# Patient Record
Sex: Female | Born: 2000 | Hispanic: No | Marital: Single | Smoking: Never smoker
Health system: Southern US, Community
[De-identification: ages and names within clinical notes are randomized; demographics above are authoritative.]

## PROBLEM LIST (undated history)

## (undated) DIAGNOSIS — Z789 Other specified health status: Secondary | ICD-10-CM

## (undated) HISTORY — PX: WISDOM TOOTH EXTRACTION: SHX21

---
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
## 2019-02-27 IMAGING — US US MFM OB COMP +14 WKS
1 series · 14 of 28 positions shown · non-contrast
Comparison: none

[Series 1: us mfm ob comp +14 wks · 118 acquisitions, 14 frames shown]
[im 5/118]
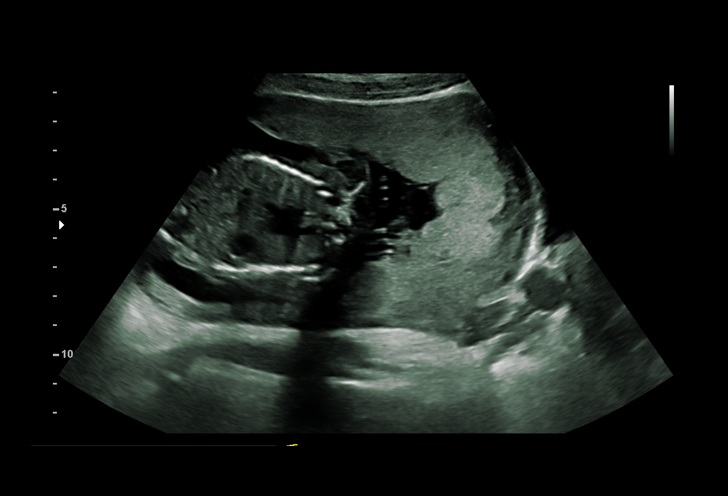
[im 14/118]
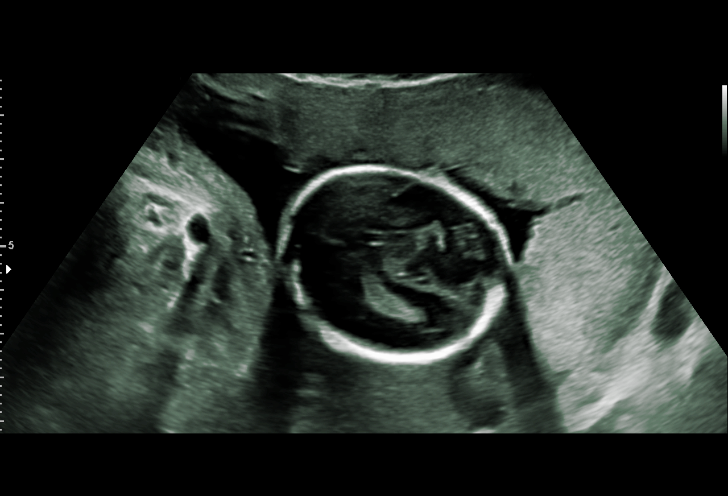
[im 22/118]
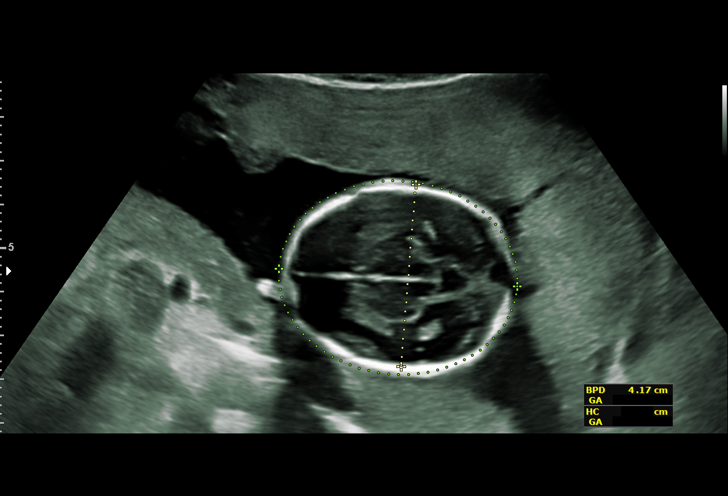
[im 31/118]
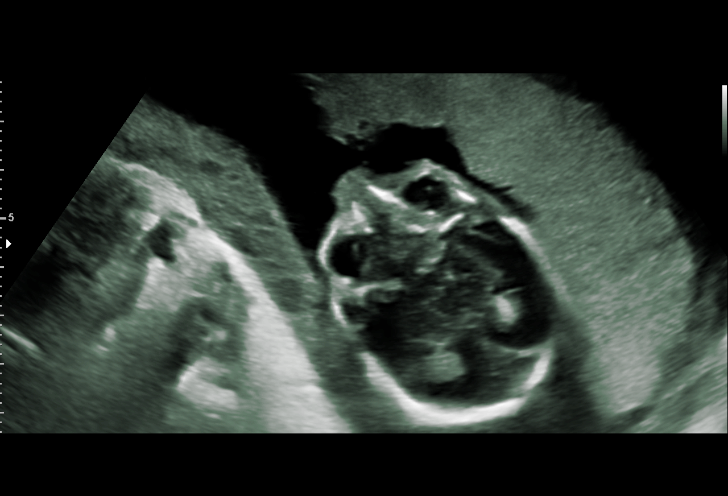
[im 40/118]
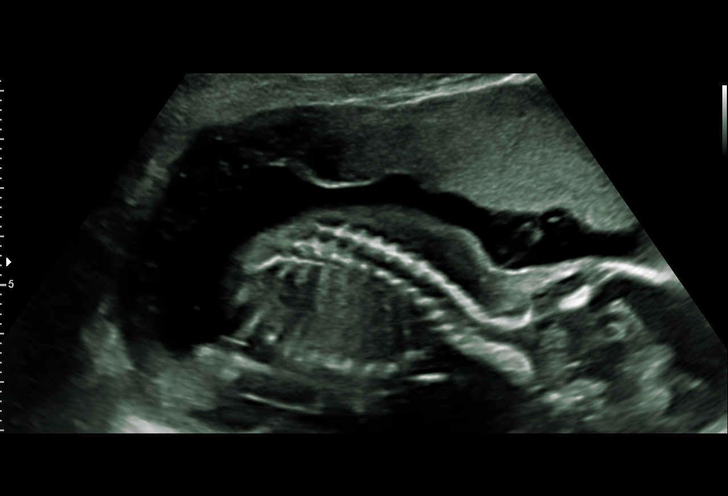
[im 48/118]
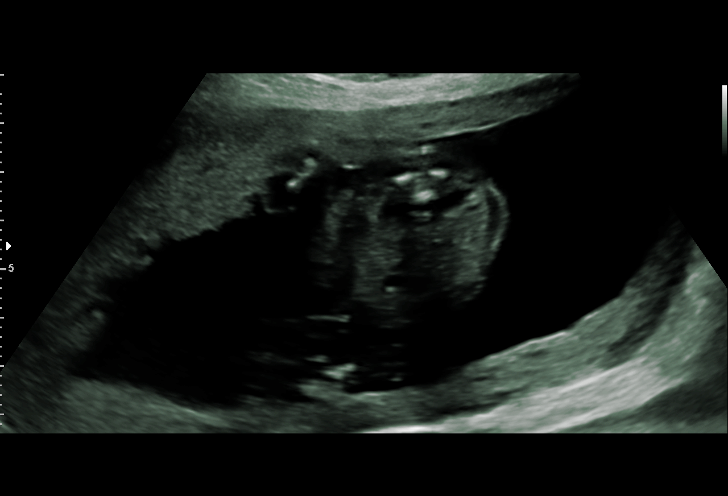
[im 57/118]
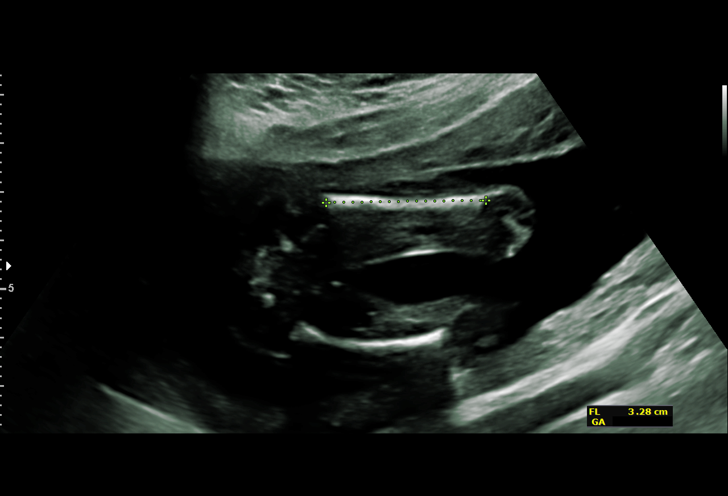
[im 66/118]
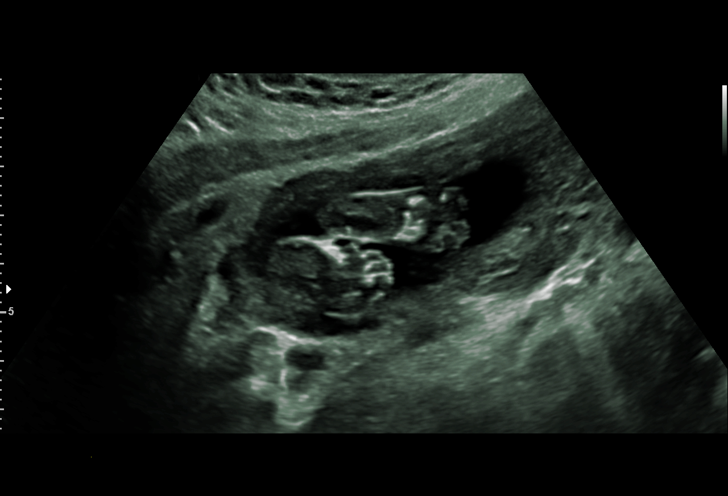
[im 74/118]
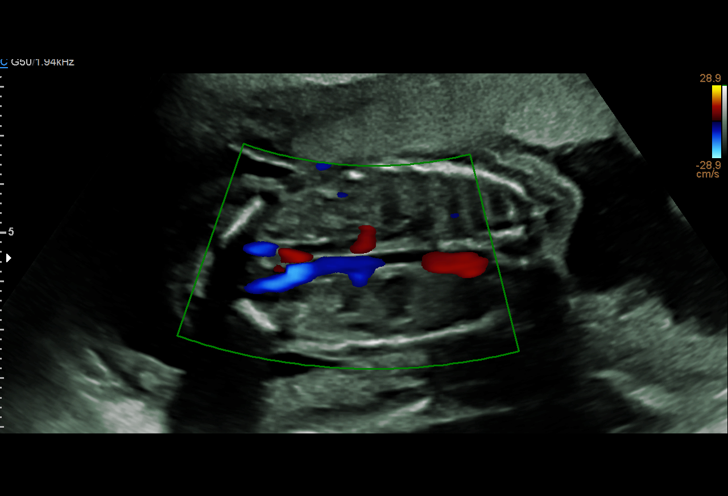
[im 83/118]
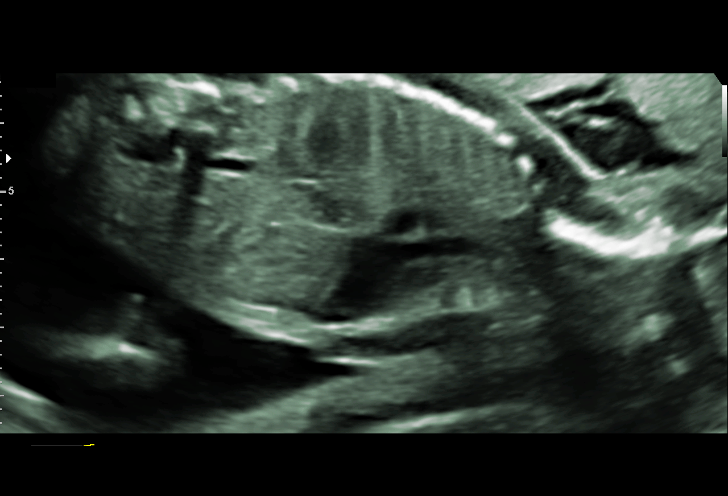
[im 92/118]
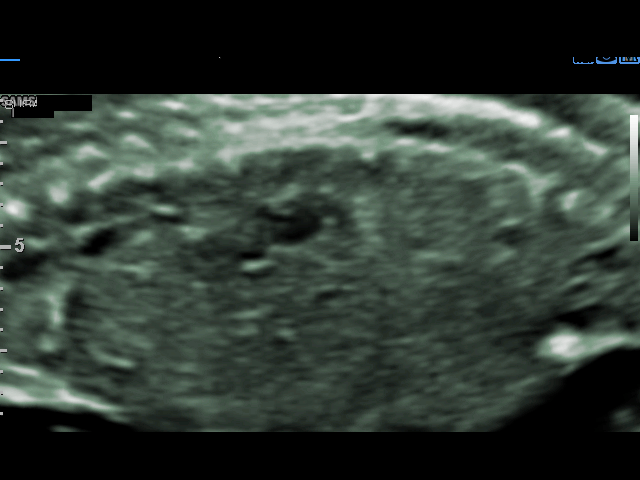
[im 100/118]
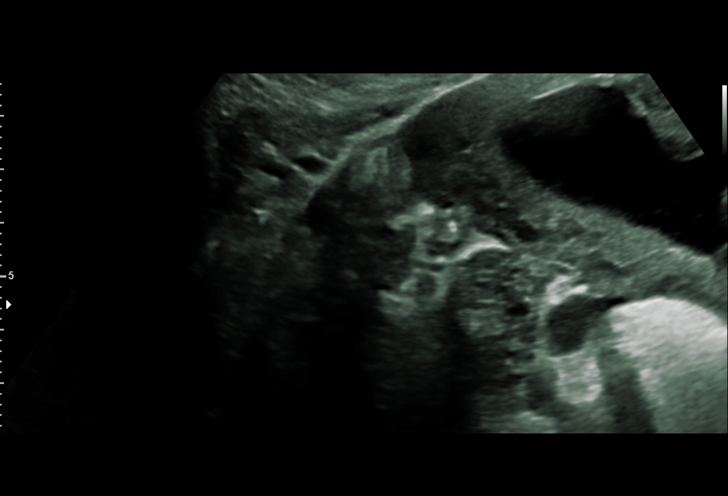
[im 109/118]
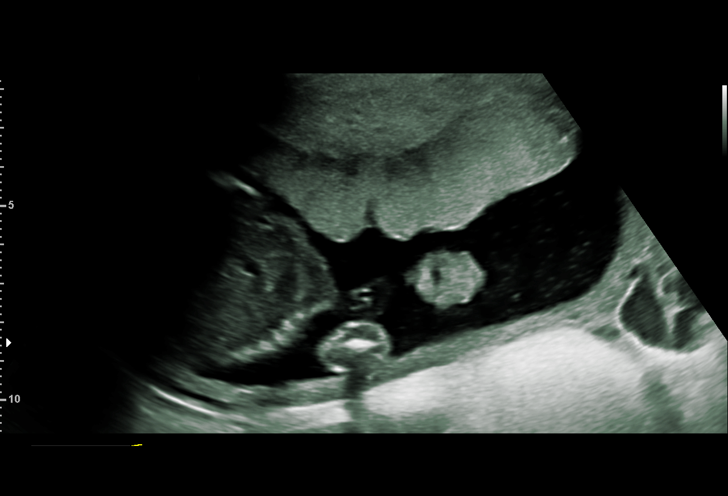
[im 118/118]
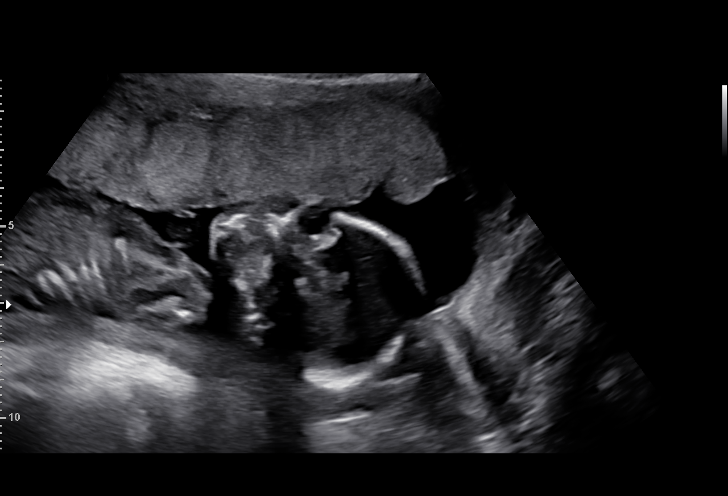

[14 of 28 positions shown; findings below may reference images not displayed]

[address]           [id number]      [id number]     [id number]
Indications

19 weeks gestation of pregnancy
Encounter for antenatal screening for
malformations
OB History

Gravidity:    1
Fetal Evaluation

Num Of Fetuses:     1
Cardiac Activity:   Observed
Presentation:       Cephalic
Placenta:           Anterior, above cervical os

Amniotic Fluid
AFI FV:      Subjectively within normal limits

Largest Pocket(cm)
4.2
Biometry

BPD:        42  mm     G. Age:  18w 5d         32  %    CI:        73.22   %    70 - 86
FL/HC:      21.5   %    16.1 -
HC:       156   mm     G. Age:  18w 4d         17  %    HC/AC:      1.14        1.09 -
AC:      137.1  mm     G. Age:  19w 1d         46  %    FL/BPD:     79.8   %
FL:       33.5  mm     G. Age:  20w 4d         86  %    FL/AC:      24.4   %    20 - 24
HUM:      32.7  mm     G. Age:  21w 0d       > 95  %
CER:      19.3  mm     G. Age:  18w 5d         37  %
NFT:       2.3  mm
LV:        5.2  mm
CM:        3.4  mm

Est. FW:     303  gm    0 lb 11 oz      53  %
Gestational Age

LMP:           19w 1d        Date:  12/04/16                 EDD:   09/10/17
U/S Today:     19w 2d                                        EDD:   09/09/17
Best:          19w 1d     Det. By:  LMP  (12/04/16)          EDD:   09/10/17
Anatomy

Cranium:               Appears normal         Aortic Arch:            Appears normal
Cavum:                 Appears normal         Ductal Arch:            Appears normal
Ventricles:            Appears normal         Diaphragm:              Appears normal
Choroid Plexus:        Appears normal         Stomach:                Appears normal, left
sided
Cerebellum:            Appears normal         Abdomen:                Appears normal
Posterior Fossa:       Appears normal         Abdominal Wall:         Appears nml (cord
insert, abd wall)
Nuchal Fold:           Appears normal         Cord Vessels:           Appears normal (3
vessel cord)
Face:                  Appears normal         Kidneys:                Appear normal
(orbits and profile)
Lips:                  Appears normal         Bladder:                Appears normal
Thoracic:              Appears normal         Spine:                  Appears normal
Heart:                 Appears normal         Upper Extremities:      Appears normal
(4CH, axis, and
situs)
RVOT:                  Appears normal         Lower Extremities:      Appears normal
LVOT:                  Appears normal

Other:  Nasal bone visualized. Heels visualized. Male gender.
Cervix Uterus Adnexa

Cervix
Length:            3.2  cm.
Normal appearance by transabdominal scan.

Left Ovary
Within normal limits.

Right Ovary
Not visualized.  No adnexal mass visualized.
Impression

SIUP at 86w8d
active fetus
no defects seen
EFW 53rd%'le
no previa
cervix is long and closed
Recommendations

Follow up as clinicallly indicated.

## 2020-09-09 ENCOUNTER — Other Ambulatory Visit: Payer: Self-pay

## 2020-09-09 ENCOUNTER — Ambulatory Visit (HOSPITAL_COMMUNITY)
Admission: EM | Admit: 2020-09-09 | Discharge: 2020-09-09 | Disposition: A | Discharge status: Home / Self Care | Payer: Medicaid Other | Attending: Physician Assistant | Admitting: Physician Assistant

## 2020-09-09 ENCOUNTER — Encounter (HOSPITAL_COMMUNITY): Payer: Self-pay

## 2020-09-09 DIAGNOSIS — R051 Acute cough: Secondary | ICD-10-CM | POA: Diagnosis not present

## 2020-09-09 DIAGNOSIS — G44209 Tension-type headache, unspecified, not intractable: Secondary | ICD-10-CM | POA: Diagnosis not present

## 2020-09-09 DIAGNOSIS — Z20822 Contact with and (suspected) exposure to covid-19: Secondary | ICD-10-CM | POA: Diagnosis not present

## 2020-09-09 DIAGNOSIS — R059 Cough, unspecified: Secondary | ICD-10-CM

## 2020-09-09 DIAGNOSIS — J029 Acute pharyngitis, unspecified: Secondary | ICD-10-CM | POA: Diagnosis not present

## 2020-09-09 DIAGNOSIS — J069 Acute upper respiratory infection, unspecified: Secondary | ICD-10-CM | POA: Diagnosis not present

## 2020-09-09 LAB — SARS CORONAVIRUS 2 (TAT 6-24 HRS): SARS Coronavirus 2: NEGATIVE

## 2020-09-09 MED ORDER — PREDNISONE 20 MG PO TABS: 40.0000 mg | ORAL_TABLET | Freq: Every day | ORAL | 0 refills | Status: AC

## 2020-09-09 MED ORDER — BENZONATATE 100 MG PO CAPS: 100.0000 mg | ORAL_CAPSULE | Freq: Three times a day (TID) | ORAL | 0 refills | Status: AC

## 2020-09-09 NOTE — ED Triage Notes (Signed)
Pt in with c/o fever tmax 99.5 today and st x 2 days  Pt has not had otc meds for sxs

## 2020-09-09 NOTE — ED Provider Notes (Signed)
[facility]    CSN: [id number] Arrival date & time: 09/09/20  1424      History   Chief Complaint Chief Complaint  Patient presents with   Fever   Sore Throat    HPI Melinda Barry is a 20 y.o. female.   Patient presents today with a 2-day history of URI symptoms.  Reports cough, nasal congestion, sneezing, sore throat, fever, fatigue.  Denies any chest pain, shortness of breath, nausea, vomiting.  She has tried over-the-counter medications without improvement of symptoms.  She reports several were contacts with similar symptoms ultimately diagnosed with COVID-19.  She has had COVID-19 vaccination but has not had flu shot.  She has a history of allergies and has been taking allergy medication as prescribed.  Denies asthma, smoking, COPD.   Past Medical History:  Diagnosis Date   Medical history non-contributory     Patient Active Problem List   Diagnosis Date Noted   Hypovitaminosis D 05/22/2016    Past Surgical History:  Procedure Laterality Date   WISDOM TOOTH EXTRACTION      OB History     Gravida  1   Para  1   Term  1   Preterm      AB      Living  1      SAB      IAB      Ectopic      Multiple  0   Live Births  1            Home Medications    Prior to Admission medications   Medication Sig Start Date End Date Taking? Authorizing Provider  benzonatate (TESSALON) 100 MG capsule Take 1 capsule (100 mg total) by mouth every 8 (eight) hours. 09/09/20  Yes Sophie Quiles K, PA-C  predniSONE (DELTASONE) 20 MG tablet Take 2 tablets (40 mg total) by mouth daily with breakfast for 4 days. 09/09/20 09/13/20 Yes Hartman Minahan, Denny Peon K, PA-C  camphor-menthol (SARNA) lotion Apply 1 application topically as needed for itching. 04/30/18   Frederica Kuster, MD  cetirizine (ZYRTEC) 10 MG tablet Take 1 tablet (10 mg total) by mouth daily. 01/21/18   Cathie Hoops, Amy V, PA-C  fluticasone (FLONASE) 50 MCG/ACT nasal spray Place 2 sprays into both nostrils daily.  01/21/18   Cathie Hoops, Amy V, PA-C  ipratropium (ATROVENT) 0.06 % nasal spray Place 2 sprays into both nostrils 4 (four) times daily. 01/21/18   Cathie Hoops, Amy V, PA-C  ondansetron (ZOFRAN ODT) 4 MG disintegrating tablet Take 1 tablet (4 mg total) by mouth every 8 (eight) hours as needed for nausea or vomiting. 04/30/18   Frederica Kuster, MD  Prenat-FeAsp-Meth-FA-DHA w/o A (PRENATE PIXIE) 10-0.6-0.4-200 MG CAPS Take 1 capsule by mouth daily before breakfast. 02/26/17   Brock Bad, MD    Family History History reviewed. No pertinent family history.  Social History Social History   Tobacco Use   Smoking status: Never   Smokeless tobacco: Never  Vaping Use   Vaping Use: Never used  Substance Use Topics   Alcohol use: No   Drug use: Not Currently    Types: Marijuana     Allergies   Advil [ibuprofen]   Review of Systems Review of Systems  Constitutional:  Positive for activity change, fatigue and fever. Negative for appetite change.  HENT:  Positive for congestion, sinus pressure, sneezing and sore throat.   Respiratory:  Positive for cough. Negative for shortness of breath.   Cardiovascular:  Negative for chest pain.  Gastrointestinal:  Negative for abdominal pain, diarrhea, nausea and vomiting.  Musculoskeletal:  Negative for arthralgias and myalgias.  Neurological:  Positive for headaches. Negative for dizziness and light-headedness.    Physical Exam Triage Vital Signs ED Triage Vitals  Enc Vitals Group     BP 09/09/20 1537 100/65     Pulse Rate 09/09/20 1537 94     Resp 09/09/20 1537 17     Temp 09/09/20 1537 100 F (37.8 C)     Temp Source 09/09/20 1537 Oral     SpO2 09/09/20 1537 98 %     Weight --      Height --      Head Circumference --      Peak Flow --      Pain Score 09/09/20 1536 8     Pain Loc --      Pain Edu? --      Excl. in GC? --    No data found.  Updated Vital Signs BP 100/65 (BP Location: Right Arm)   Pulse 94   Temp 100 F (37.8 C) (Oral)    Resp 17   LMP 08/29/2020 (Exact Date)   SpO2 98%   Visual Acuity Right Eye Distance:   Left Eye Distance:   Bilateral Distance:    Right Eye Near:   Left Eye Near:    Bilateral Near:     Physical Exam Vitals reviewed.  Constitutional:      General: She is awake. She is not in acute distress.    Appearance: Normal appearance. She is normal weight. She is not ill-appearing.     Comments: Very pleasant female appears stated age in no acute distress sitting comfortably on exam table  HENT:     Head: Normocephalic and atraumatic.     Right Ear: Tympanic membrane, ear canal and external ear normal. Tympanic membrane is not erythematous or bulging.     Left Ear: Tympanic membrane, ear canal and external ear normal. Tympanic membrane is not erythematous or bulging.     Nose:     Right Sinus: No maxillary sinus tenderness or frontal sinus tenderness.     Left Sinus: No maxillary sinus tenderness or frontal sinus tenderness.     Mouth/Throat:     Pharynx: Uvula midline. No oropharyngeal exudate or posterior oropharyngeal erythema.  Cardiovascular:     Rate and Rhythm: Normal rate and regular rhythm.     Heart sounds: Normal heart sounds, S1 normal and S2 normal. No murmur heard. Pulmonary:     Effort: Pulmonary effort is normal.     Breath sounds: Normal breath sounds. No wheezing, rhonchi or rales.     Comments: Clear to auscultation bilaterally Musculoskeletal:     Cervical back: Normal range of motion and neck supple.  Lymphadenopathy:     Head:     Right side of head: No submental, submandibular or tonsillar adenopathy.     Left side of head: No submental, submandibular or tonsillar adenopathy.     Cervical: No cervical adenopathy.  Psychiatric:        Behavior: Behavior is cooperative.     UC Treatments / Results  Labs (all labs ordered are listed, but only abnormal results are displayed) Labs Reviewed  SARS CORONAVIRUS 2 (TAT 6-24 HRS)    EKG   Radiology No  results found.  Procedures Procedures (including critical care time)  Medications Ordered in UC Medications - No data to display  Initial Impression /  Assessment and Plan / UC Course  I have reviewed the triage vital signs and the nursing notes.  Pertinent labs & imaging results that were available during my care of the patient were reviewed by me and considered in my medical decision making (see chart for details).     Likely viral etiology given short duration of symptoms.  Offered flu testing but patient inclined this.  COVID-19 testing was obtained-results pending.  The patient was prescribed prednisone burst with instruction not to take NSAIDs with this medication due to risk of GI bleeding.  She was prescribed Tessalon for cough.  Patient has no concern for pregnancy and is not currently breast-feeding.  She was encouraged use over-the-counter medications for symptom management.  Recommended rest and drinking plenty of fluid.  Strict return precautions given to which patient expressed understanding.   Final Clinical Impressions(s) / UC Diagnoses   Final diagnoses:  Upper respiratory tract infection, unspecified type  Cough  Sore throat  Tension-type headache, not intractable, unspecified chronicity pattern     Discharge Instructions      Please monitor MyChart for your COVID-19 results.  Take prednisone in the morning for 4 days.  Do not take NSAIDs including aspirin, ibuprofen/Advil, naproxen/Aleve with this medication as a cause stomach bleeding.  Use Tessalon for cough.  If anything worsens please return for reevaluation.       ED Prescriptions     Medication Sig Dispense Auth. Provider   predniSONE (DELTASONE) 20 MG tablet Take 2 tablets (40 mg total) by mouth daily with breakfast for 4 days. 8 tablet Lauralee Waters K, PA-C   benzonatate (TESSALON) 100 MG capsule Take 1 capsule (100 mg total) by mouth every 8 (eight) hours. 21 capsule [place] Urieta K, PA-C       PDMP not reviewed this encounter.   Jeani Hawking, PA-C 09/09/20 1615

## 2020-09-09 NOTE — Discharge Instructions (Addendum)
Please monitor MyChart for your COVID-19 results.  Take prednisone in the morning for 4 days.  Do not take NSAIDs including aspirin, ibuprofen/Advil, naproxen/Aleve with this medication as a cause stomach bleeding.  Use Tessalon for cough.  If anything worsens please return for reevaluation.

## 2020-09-13 ENCOUNTER — Encounter (HOSPITAL_COMMUNITY): Payer: Self-pay

## 2020-09-13 ENCOUNTER — Ambulatory Visit (HOSPITAL_COMMUNITY)
Admission: EM | Admit: 2020-09-13 | Discharge: 2020-09-13 | Disposition: A | Discharge status: Home / Self Care | Payer: Medicaid Other | Attending: Student | Admitting: Student

## 2020-09-13 ENCOUNTER — Other Ambulatory Visit: Payer: Self-pay

## 2020-09-13 DIAGNOSIS — J0110 Acute frontal sinusitis, unspecified: Secondary | ICD-10-CM

## 2020-09-13 DIAGNOSIS — Z3202 Encounter for pregnancy test, result negative: Secondary | ICD-10-CM | POA: Diagnosis not present

## 2020-09-13 LAB — POC URINE PREG, ED: Preg Test, Ur: NEGATIVE

## 2020-09-13 MED ORDER — AMOXICILLIN-POT CLAVULANATE 875-125 MG PO TABS: 1.0000 | ORAL_TABLET | Freq: Two times a day (BID) | ORAL | 0 refills | Status: AC

## 2020-09-13 NOTE — Discharge Instructions (Addendum)
-  Your pregnancy test is negative today. -For your sinus infection, -Start the antibiotic-Augmentin (amoxicillin-clavulanate), 1 pill every 12 hours for 7 days.  You can take this with food like with breakfast and dinner.

## 2020-09-13 NOTE — ED Triage Notes (Signed)
Pt presents for follow up for headache, nasal congestion, and body aches that is unrelieved with prescribed medication.

## 2020-09-13 NOTE — ED Provider Notes (Signed)
[facility]    CSN: [id number] Arrival date & time: 09/13/20  1742      History   Chief Complaint Chief Complaint  Patient presents with   Follow-up    HPI Melinda Barry is a 20 y.o. female presenting with nasal congestion and sinus pressure.  She was last at our urgent care on 6/17 for similar symptoms, at that time she was treated with prednisone and Tessalon.  Negative COVID test.  Today she is again presenting with nonproductive cough, nasal congestion, worsening sinus pressure.  Describes this as throbbing pain behind her left eye and left nose x7 days getting worse.  Denies fever/chills.  She is also requesting pregnancy test. Denies fevers/chills, n/v/d, shortness of breath, chest pain, teeth pain, headaches, sore throat, loss of taste/smell, swollen lymph nodes, ear pain.    HPI  Past Medical History:  Diagnosis Date   Medical history non-contributory     Patient Active Problem List   Diagnosis Date Noted   Hypovitaminosis D 05/22/2016    Past Surgical History:  Procedure Laterality Date   WISDOM TOOTH EXTRACTION      OB History     Gravida  1   Para  1   Term  1   Preterm      AB      Living  1      SAB      IAB      Ectopic      Multiple  0   Live Births  1            Home Medications    Prior to Admission medications   Medication Sig Start Date End Date Taking? Authorizing Provider  amoxicillin-clavulanate (AUGMENTIN) 875-125 MG tablet Take 1 tablet by mouth every 12 (twelve) hours. 09/13/20  Yes Rhys Martini, PA-C  benzonatate (TESSALON) 100 MG capsule Take 1 capsule (100 mg total) by mouth every 8 (eight) hours. 09/09/20   Raspet, Noberto Retort, PA-C  camphor-menthol (SARNA) lotion Apply 1 application topically as needed for itching. 04/30/18   Frederica Kuster, MD  cetirizine (ZYRTEC) 10 MG tablet Take 1 tablet (10 mg total) by mouth daily. 01/21/18   Cathie Hoops, Amy V, PA-C  fluticasone (FLONASE) 50 MCG/ACT nasal spray Place 2  sprays into both nostrils daily. 01/21/18   Cathie Hoops, Amy V, PA-C  ipratropium (ATROVENT) 0.06 % nasal spray Place 2 sprays into both nostrils 4 (four) times daily. 01/21/18   Cathie Hoops, Amy V, PA-C  ondansetron (ZOFRAN ODT) 4 MG disintegrating tablet Take 1 tablet (4 mg total) by mouth every 8 (eight) hours as needed for nausea or vomiting. 04/30/18   Frederica Kuster, MD  predniSONE (DELTASONE) 20 MG tablet Take 2 tablets (40 mg total) by mouth daily with breakfast for 4 days. 09/09/20 09/13/20  Raspet, Noberto Retort, PA-C  Prenat-FeAsp-Meth-FA-DHA w/o A (PRENATE PIXIE) 10-0.6-0.4-200 MG CAPS Take 1 capsule by mouth daily before breakfast. 02/26/17   Brock Bad, MD    Family History History reviewed. No pertinent family history.  Social History Social History   Tobacco Use   Smoking status: Never   Smokeless tobacco: Never  Vaping Use   Vaping Use: Never used  Substance Use Topics   Alcohol use: No   Drug use: Not Currently    Types: Marijuana     Allergies   Advil [ibuprofen]   Review of Systems Review of Systems  Constitutional:  Negative for appetite change, chills and fever.  HENT:  Positive for congestion and sinus pressure. Negative for ear pain, rhinorrhea, sinus pain and sore throat.   Eyes:  Negative for redness and visual disturbance.  Respiratory:  Negative for cough, chest tightness, shortness of breath and wheezing.   Cardiovascular:  Negative for chest pain and palpitations.  Gastrointestinal:  Negative for abdominal pain, constipation, diarrhea, nausea and vomiting.  Genitourinary:  Negative for dysuria, frequency and urgency.  Musculoskeletal:  Negative for myalgias.  Neurological:  Negative for dizziness, weakness and headaches.  Psychiatric/Behavioral:  Negative for confusion.   All other systems reviewed and are negative.   Physical Exam Triage Vital Signs ED Triage Vitals  Enc Vitals Group     BP 09/13/20 1831 98/68     Pulse Rate 09/13/20 1831 93     Resp  09/13/20 1831 17     Temp 09/13/20 1831 98.6 F (37 C)     Temp Source 09/13/20 1831 Oral     SpO2 09/13/20 1831 98 %     Weight --      Height --      Head Circumference --      Peak Flow --      Pain Score 09/13/20 1834 5     Pain Loc --      Pain Edu? --      Excl. in GC? --    No data found.  Updated Vital Signs BP 98/68 (BP Location: Left Arm)   Pulse 93   Temp 98.6 F (37 C) (Oral)   Resp 17   LMP 08/29/2020 (Exact Date)   SpO2 98%   Visual Acuity Right Eye Distance:   Left Eye Distance:   Bilateral Distance:    Right Eye Near:   Left Eye Near:    Bilateral Near:     Physical Exam Vitals reviewed.  Constitutional:      General: She is not in acute distress.    Appearance: Normal appearance. She is not ill-appearing.  HENT:     Head: Normocephalic and atraumatic.     Right Ear: Hearing, tympanic membrane, ear canal and external ear normal. No swelling or tenderness. There is no impacted cerumen. No mastoid tenderness. Tympanic membrane is not perforated, erythematous, retracted or bulging.     Left Ear: Hearing, tympanic membrane, ear canal and external ear normal. No swelling or tenderness. There is no impacted cerumen. No mastoid tenderness. Tympanic membrane is not perforated, erythematous, retracted or bulging.     Nose:     Right Sinus: Frontal sinus tenderness present. No maxillary sinus tenderness.     Left Sinus: Frontal sinus tenderness present. No maxillary sinus tenderness.     Mouth/Throat:     Mouth: Mucous membranes are moist.     Pharynx: Uvula midline. No oropharyngeal exudate or posterior oropharyngeal erythema.     Tonsils: No tonsillar exudate.  Cardiovascular:     Rate and Rhythm: Normal rate and regular rhythm.     Heart sounds: Normal heart sounds.  Pulmonary:     Breath sounds: Normal breath sounds and air entry. No wheezing, rhonchi or rales.  Chest:     Chest wall: No tenderness.  Abdominal:     General: Abdomen is flat. Bowel  sounds are normal.     Tenderness: There is no abdominal tenderness. There is no guarding or rebound.  Lymphadenopathy:     Cervical: No cervical adenopathy.  Neurological:     General: No focal deficit present.     Mental  Status: She is alert and oriented to person, place, and time.  Psychiatric:        Attention and Perception: Attention and perception normal.        Mood and Affect: Mood and affect normal.        Behavior: Behavior normal. Behavior is cooperative.        Thought Content: Thought content normal.        Judgment: Judgment normal.     UC Treatments / Results  Labs (all labs ordered are listed, but only abnormal results are displayed) Labs Reviewed  POC URINE PREG, ED    EKG   Radiology No results found.  Procedures Procedures (including critical care time)  Medications Ordered in UC Medications - No data to display  Initial Impression / Assessment and Plan / UC Course  I have reviewed the triage vital signs and the nursing notes.  Pertinent labs & imaging results that were available during my care of the patient were reviewed by me and considered in my medical decision making (see chart for details).     This patient is a 20 year old female presenting with sinusitis following viral URI. Today this pt is afebrile nontachycardic nontachypneic, oxygenating well on room air, no wheezes rhonchi or rales.   Urine pregnancy negative. Covid PCR negative 6/17.   Augmentin sent as below.  Coding this visit a Level 4 for review of past notes (6/17), ordering and interpretation of labs today, and prescription drug management.   Final Clinical Impressions(s) / UC Diagnoses   Final diagnoses:  Acute non-recurrent frontal sinusitis  Negative pregnancy test     Discharge Instructions      -Your pregnancy test is negative today. -For your sinus infection, -Start the antibiotic-Augmentin (amoxicillin-clavulanate), 1 pill every 12 hours for 7 days.  You  can take this with food like with breakfast and dinner.      ED Prescriptions     Medication Sig Dispense Auth. Provider   amoxicillin-clavulanate (AUGMENTIN) 875-125 MG tablet Take 1 tablet by mouth every 12 (twelve) hours. 14 tablet Rhys Martini, PA-C      PDMP not reviewed this encounter.   Rhys Martini, PA-C 09/13/20 1940
